# Patient Record
Sex: Male | Born: 1975 | Race: Black or African American | Hispanic: No | Marital: Single | Smoking: Current every day smoker
Health system: Southern US, Community
[De-identification: ages and names within clinical notes are randomized; demographics above are authoritative.]

---

## 2018-03-31 ENCOUNTER — Emergency Department (HOSPITAL_COMMUNITY)
Admission: EM | Admit: 2018-03-31 | Discharge: 2018-03-31 | Payer: Self-pay | Attending: Emergency Medicine | Admitting: Emergency Medicine

## 2018-03-31 ENCOUNTER — Emergency Department (HOSPITAL_COMMUNITY): Payer: Self-pay

## 2018-03-31 DIAGNOSIS — Y929 Unspecified place or not applicable: Secondary | ICD-10-CM | POA: Insufficient documentation

## 2018-03-31 DIAGNOSIS — W540XXA Bitten by dog, initial encounter: Secondary | ICD-10-CM | POA: Insufficient documentation

## 2018-03-31 DIAGNOSIS — S31131A Puncture wound of abdominal wall without foreign body, left upper quadrant without penetration into peritoneal cavity, initial encounter: Secondary | ICD-10-CM | POA: Insufficient documentation

## 2018-03-31 DIAGNOSIS — Z23 Encounter for immunization: Secondary | ICD-10-CM | POA: Insufficient documentation

## 2018-03-31 DIAGNOSIS — Y939 Activity, unspecified: Secondary | ICD-10-CM | POA: Insufficient documentation

## 2018-03-31 DIAGNOSIS — Y999 Unspecified external cause status: Secondary | ICD-10-CM | POA: Insufficient documentation

## 2018-03-31 DIAGNOSIS — S30811A Abrasion of abdominal wall, initial encounter: Secondary | ICD-10-CM | POA: Insufficient documentation

## 2018-03-31 LAB — BASIC METABOLIC PANEL
ANION GAP: 9 (ref 5–15)
BUN: 7 mg/dL (ref 6–20)
CALCIUM: 9 mg/dL (ref 8.9–10.3)
CO2: 24 mmol/L (ref 22–32)
Chloride: 108 mmol/L (ref 98–111)
Creatinine, Ser: 1.27 mg/dL — ABNORMAL HIGH (ref 0.61–1.24)
GFR calc Af Amer: >60 mL/min (ref >60)
GFR calc non Af Amer: >60 mL/min (ref >60)
Glucose, Bld: 108 mg/dL — ABNORMAL HIGH (ref 70–99)
Potassium: 3.6 mmol/L (ref 3.5–5.1)
Sodium: 141 mmol/L (ref 135–145)

## 2018-03-31 LAB — CBC
HEMATOCRIT: 41.2 % (ref 39.0–52.0)
HEMOGLOBIN: 13 g/dL (ref 13.0–17.0)
MCH: 29.9 pg (ref 26.0–34.0)
MCHC: 31.6 g/dL (ref 30.0–36.0)
MCV: 94.7 fL (ref 78.0–100.0)
Platelets: 353 10*3/uL (ref 150–400)
RBC: 4.35 MIL/uL (ref 4.22–5.81)
RDW: 12.7 % (ref 11.5–15.5)
WBC: 8.1 10*3/uL (ref 4.0–10.5)

## 2018-03-31 LAB — I-STAT TROPONIN, ED: TROPONIN I, POC: 0 ng/mL (ref 0.00–0.08)

## 2018-03-31 MED ORDER — TETANUS-DIPHTH-ACELL PERTUSSIS 5-2.5-18.5 LF-MCG/0.5 IM SUSP
0.5000 mL | Freq: Once | INTRAMUSCULAR | Status: AC
  Administered 2018-03-31: 0.5 mL via INTRAMUSCULAR
  Filled 2018-03-31: qty 0.5

## 2018-03-31 MED ORDER — AMOXICILLIN-POT CLAVULANATE 875-125 MG PO TABS: 1.0000 | ORAL_TABLET | Freq: Two times a day (BID) | ORAL | 0 refills | Status: DC
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
## 2018-03-31 NOTE — ED Notes (Signed)
Wounds cleaned w/ saf-cleans, bacitracin applied
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
## 2020-06-21 ENCOUNTER — Other Ambulatory Visit: Payer: Self-pay

## 2020-06-21 ENCOUNTER — Encounter (HOSPITAL_COMMUNITY): Payer: Self-pay

## 2020-06-21 ENCOUNTER — Emergency Department (HOSPITAL_COMMUNITY)
Admission: EM | Admit: 2020-06-21 | Discharge: 2020-06-21 | Disposition: A | Discharge status: Home / Self Care | Payer: Self-pay | Attending: Emergency Medicine | Admitting: Emergency Medicine

## 2020-06-21 ENCOUNTER — Emergency Department (HOSPITAL_COMMUNITY): Payer: Self-pay

## 2020-06-21 DIAGNOSIS — S82201A Unspecified fracture of shaft of right tibia, initial encounter for closed fracture: Secondary | ICD-10-CM | POA: Insufficient documentation

## 2020-06-21 DIAGNOSIS — S0101XA Laceration without foreign body of scalp, initial encounter: Secondary | ICD-10-CM | POA: Insufficient documentation

## 2020-06-21 DIAGNOSIS — F17210 Nicotine dependence, cigarettes, uncomplicated: Secondary | ICD-10-CM | POA: Insufficient documentation

## 2020-06-21 DIAGNOSIS — S82121A Displaced fracture of lateral condyle of right tibia, initial encounter for closed fracture: Secondary | ICD-10-CM

## 2020-06-21 LAB — COMPREHENSIVE METABOLIC PANEL
ALT: 30 U/L (ref 0–44)
AST: 32 U/L (ref 15–41)
Albumin: 3.6 g/dL (ref 3.5–5.0)
Alkaline Phosphatase: 87 U/L (ref 38–126)
Anion gap: 11 (ref 5–15)
BUN: 13 mg/dL (ref 6–20)
CO2: 21 mmol/L — ABNORMAL LOW (ref 22–32)
Calcium: 9 mg/dL (ref 8.9–10.3)
Chloride: 106 mmol/L (ref 98–111)
Creatinine, Ser: 1.38 mg/dL — ABNORMAL HIGH (ref 0.61–1.24)
GFR calc Af Amer: >60 mL/min (ref >60)
GFR calc non Af Amer: >60 mL/min (ref >60)
Glucose, Bld: 103 mg/dL — ABNORMAL HIGH (ref 70–99)
Potassium: 3.8 mmol/L (ref 3.5–5.1)
Sodium: 138 mmol/L (ref 135–145)
Total Bilirubin: 0.7 mg/dL (ref 0.3–1.2)
Total Protein: 6.8 g/dL (ref 6.5–8.1)

## 2020-06-21 LAB — CBC
HCT: 38.5 % — ABNORMAL LOW (ref 39.0–52.0)
Hemoglobin: 12.6 g/dL — ABNORMAL LOW (ref 13.0–17.0)
MCH: 29.2 pg (ref 26.0–34.0)
MCHC: 32.7 g/dL (ref 30.0–36.0)
MCV: 89.1 fL (ref 80.0–100.0)
Platelets: 427 10*3/uL — ABNORMAL HIGH (ref 150–400)
RBC: 4.32 MIL/uL (ref 4.22–5.81)
RDW: 13.2 % (ref 11.5–15.5)
WBC: 8 10*3/uL (ref 4.0–10.5)
nRBC: 0 % (ref 0.0–0.2)

## 2020-06-21 LAB — SAMPLE TO BLOOD BANK

## 2020-06-21 LAB — PROTIME-INR
INR: 1.1 (ref 0.8–1.2)
Prothrombin Time: 14 seconds (ref 11.4–15.2)

## 2020-06-21 LAB — ETHANOL: Alcohol, Ethyl (B): <10 mg/dL (ref <10)

## 2020-06-21 MED ORDER — HYDROCODONE-ACETAMINOPHEN 5-325 MG PO TABS
1.0000 | ORAL_TABLET | ORAL | 0 refills | Status: AC | PRN
Start: 2020-06-21 — End: ?

## 2020-06-21 MED ORDER — IBUPROFEN 400 MG PO TABS: 400.0000 mg | ORAL_TABLET | Freq: Four times a day (QID) | ORAL | 0 refills | Status: AC | PRN
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
## 2020-06-21 NOTE — Discharge Instructions (Signed)
Apply ice for thirty minutes at a time, four times a day.  Keep your right leg elevated as much as possible.  Do not put any weight on your right leg until cleared by the orthopedic doctor.

## 2020-06-21 NOTE — ED Notes (Signed)
Patient verbalized understanding of dc instructions, vss, ambulatory with nad.  Patient released to [employer] upon discharge.

## 2020-06-21 NOTE — Progress Notes (Signed)
Orthopedic Tech Progress Note Patient Details:  Delos Klich [birth year] [id number]  Ortho Devices Type of Ortho Device: Knee Immobilizer, Crutches Ortho Device/Splint Location: RLE Ortho Device/Splint Interventions: Application, Adjustment   Post Interventions Patient Tolerated: Well Instructions Provided: Adjustment of device   Epimenio Schetter E Zanyla Klebba 06/21/2020, 6:18 AM

## 2020-06-21 NOTE — ED Notes (Signed)
Patient transported to CT 
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
## 2020-06-21 NOTE — ED Notes (Signed)
Ortho contacted for knee immobilizer and crutches.
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
## 2021-12-12 IMAGING — CT CT KNEE*R* W/O CM
3 series · 12 of 33 positions shown, 14 images · non-contrast
Comparison: Right knee radiographs 06/21/2020

CLINICAL DATA: Follow-up of tibial plateau fracture demonstrated on
previous radiographs.

EXAM:
CT OF THE right KNEE WITHOUT CONTRAST
TECHNIQUE: Multidetector CT imaging of the right knee was performed according
to the standard protocol. Multiplanar CT image reconstructions were
also generated.

[Series 5: extremity soft tissue · axial · 0.42mm/px · z∈[+546,+744]mm · 4 of 143 slices shown, 5 images]
[im 22/143  soft-tissue]
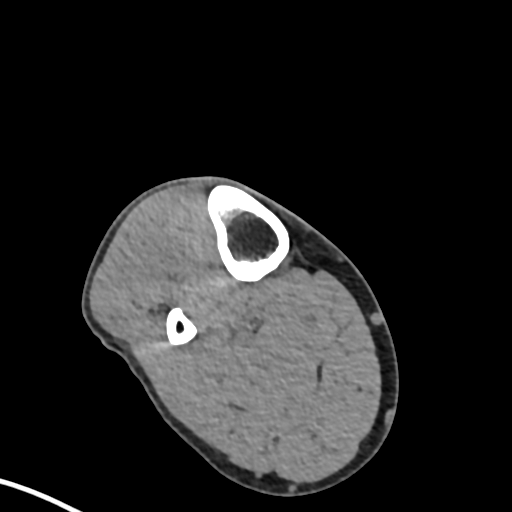
[im 22/143  bone]
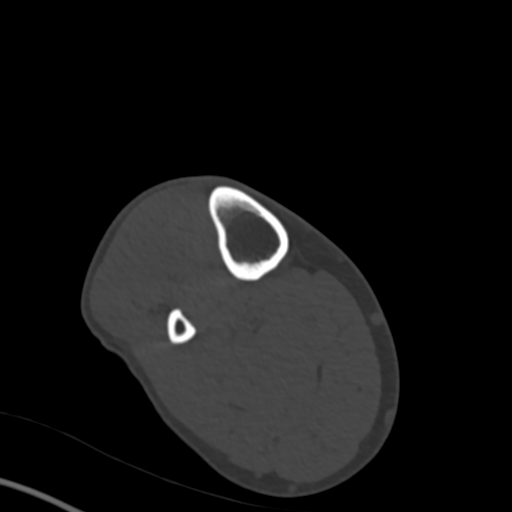
[im 55/143  bone]
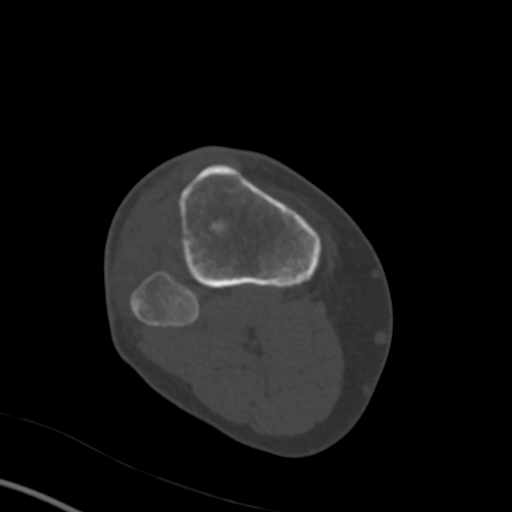
[im 88/143  bone]
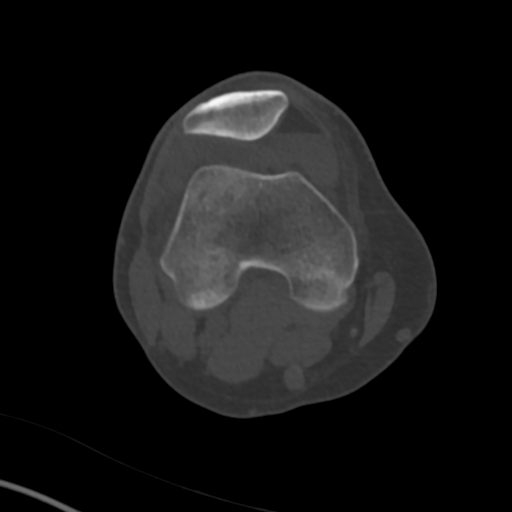
[im 121/143  bone]
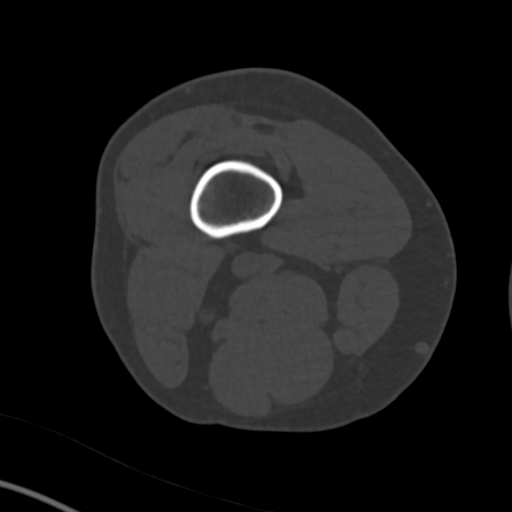

[Series 9: cor soft tissue · coronal · 0.39mm/px · 3 of 87 slices shown]
[im 18/87  bone]
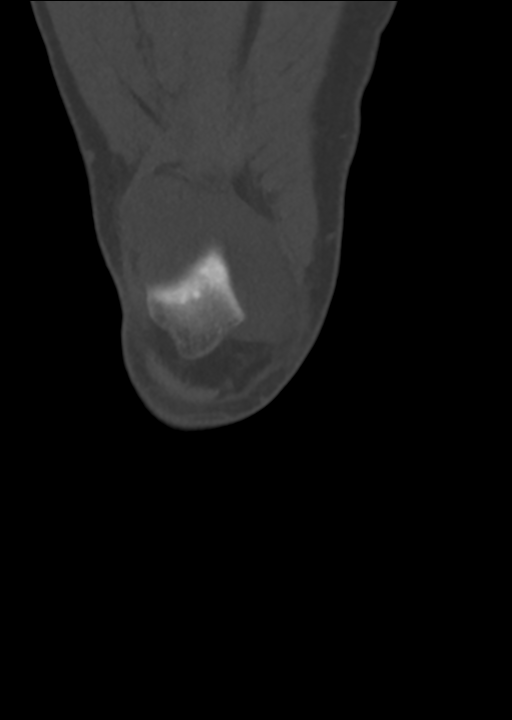
[im 35/87  bone]
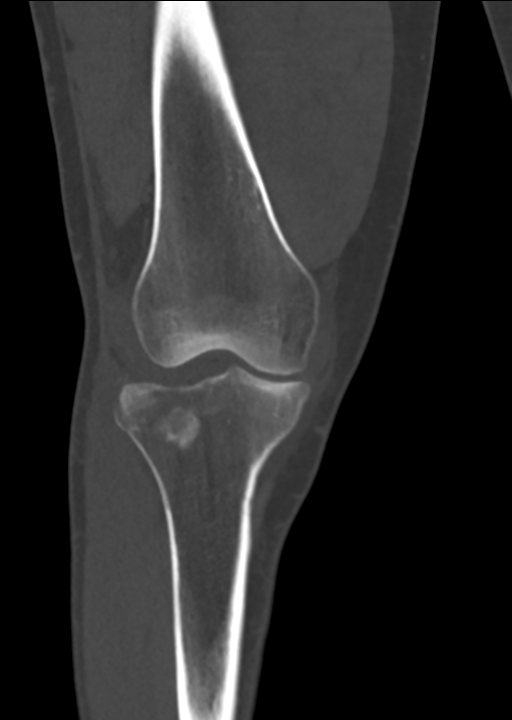
[im 52/87  bone]
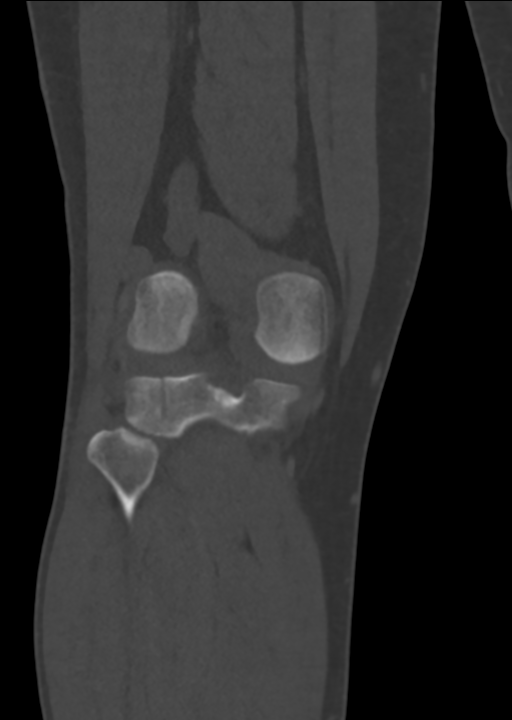

[Series 10: sag soft tissue · sagittal · 0.34mm/px · 5 of 80 slices shown, 6 images]
[im 27/80  bone]
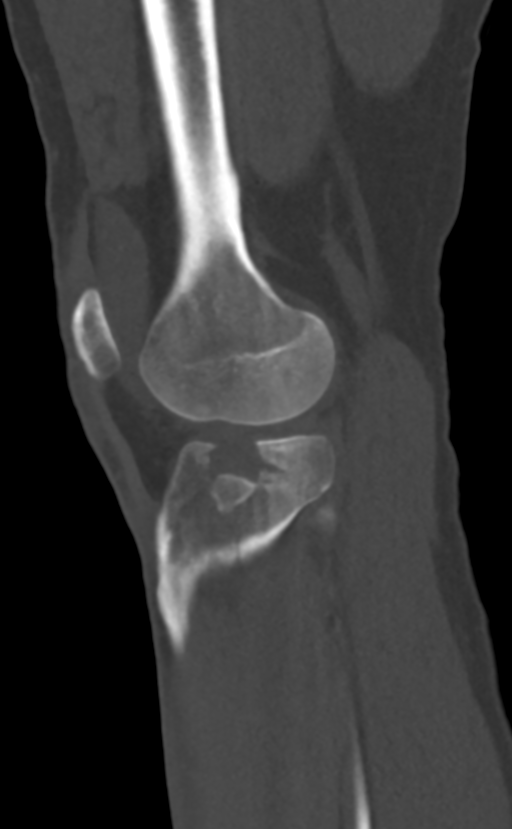
[im 33/80  bone]
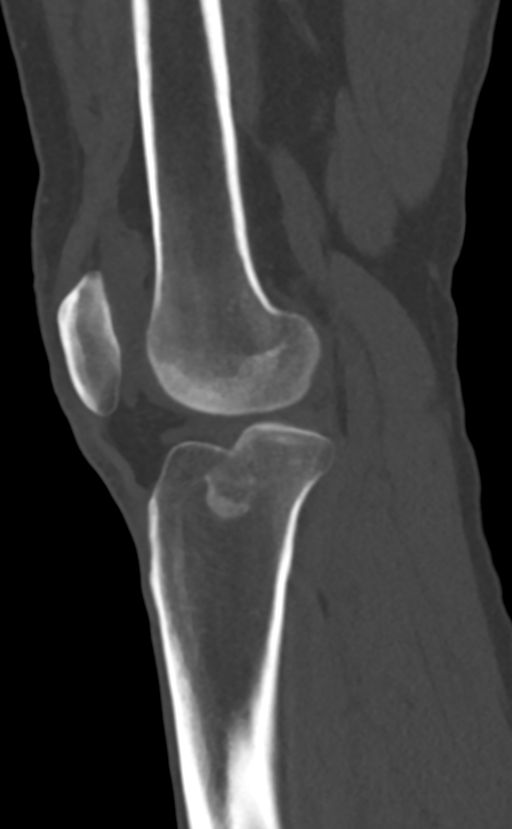
[im 40/80  soft-tissue]
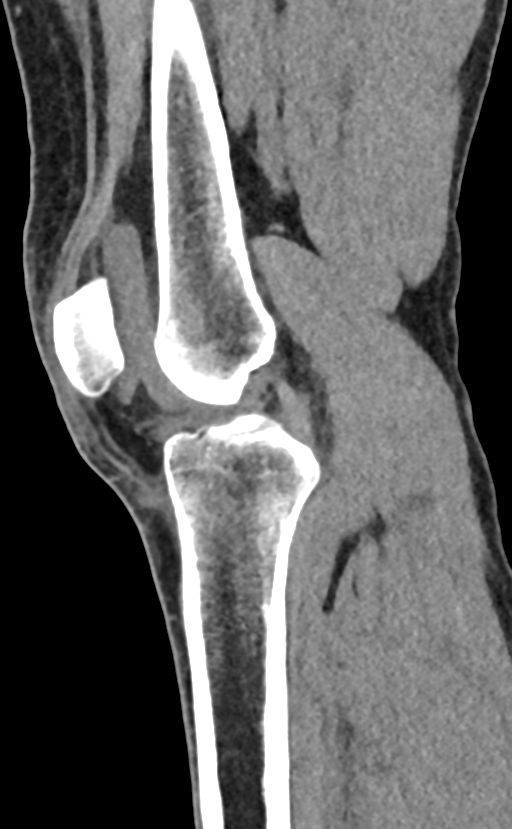
[im 40/80  bone]
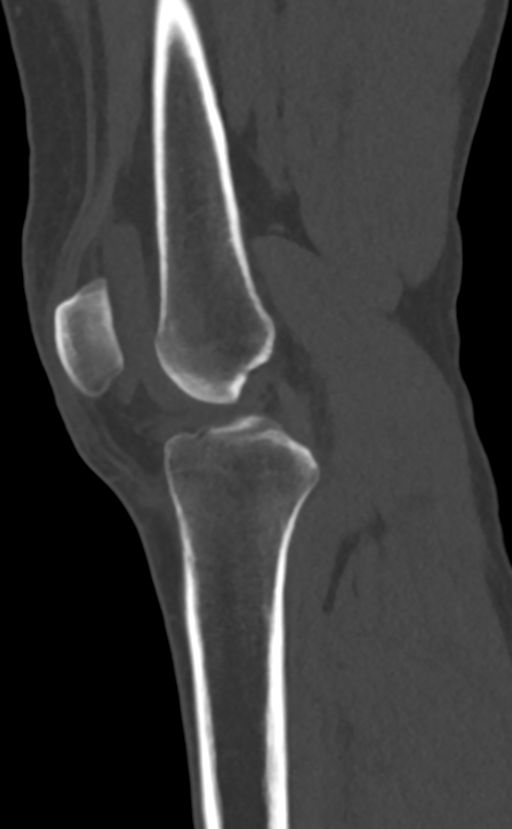
[im 47/80  bone]
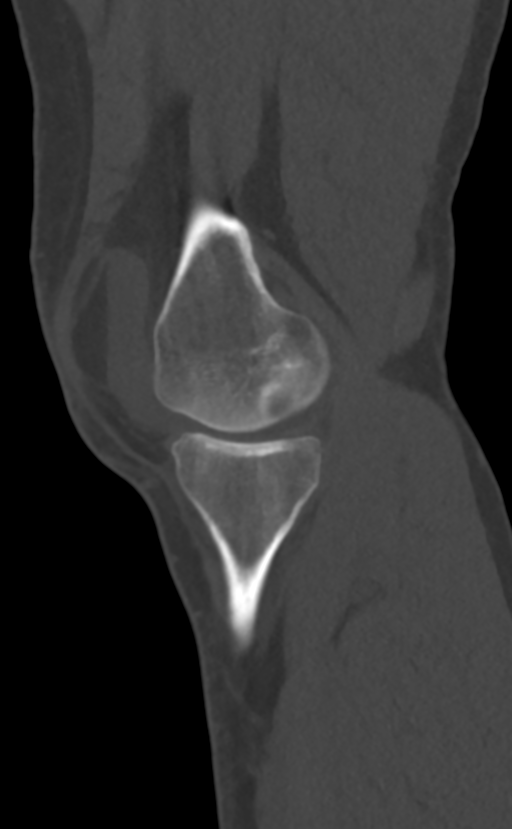
[im 53/80  bone]
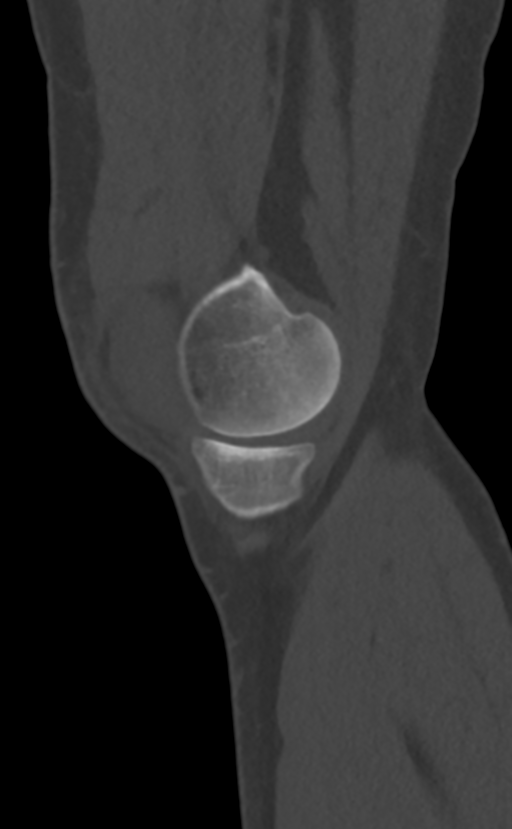

[12 of 33 positions shown; findings below may reference images not displayed]

FINDINGS: Bones/Joint/Cartilage

Comminuted and depressed fractures of the lateral tibial plateau are
present. There is a large depressed articular fragment with about 17
mm cortical depression and a 16 mm gap in the articular cortex of
the lateral compartment. Fracture lines extend to the lateral tibial
metaphysis and involve the proximal tibia fibular joint.
Moderate-sized effusion.

Ligaments

Suboptimally assessed by CT.

Muscles and Tendons

Normal appearance of the visualized musculature. No mass or
hematoma.

Soft tissues

Mild edema in the anterior subcutaneous fat.
IMPRESSION: 1. Comminuted and depressed fractures of the lateral tibial plateau.
There is a large depressed articular fragment with about 17 mm
cortical depression and a 16 mm gap in the articular cortex of the
lateral compartment.
2. Moderate-sized effusion.
